# Patient Record
Sex: Female | Born: 1995 | Race: Black or African American | Hispanic: No | Marital: Single | Smoking: Never smoker
Health system: Southern US, Community
[De-identification: ages and names within clinical notes are randomized; demographics above are authoritative.]

## PROBLEM LIST (undated history)

## (undated) DIAGNOSIS — F99 Mental disorder, not otherwise specified: Secondary | ICD-10-CM

---
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
## 2015-08-17 ENCOUNTER — Emergency Department (HOSPITAL_COMMUNITY): Payer: No Typology Code available for payment source

## 2015-08-17 ENCOUNTER — Emergency Department (HOSPITAL_COMMUNITY)
Admission: EM | Admit: 2015-08-17 | Discharge: 2015-08-17 | Disposition: A | Discharge status: Home / Self Care | Payer: No Typology Code available for payment source | Attending: Emergency Medicine | Admitting: Emergency Medicine

## 2015-08-17 ENCOUNTER — Encounter (HOSPITAL_COMMUNITY): Payer: Self-pay | Admitting: *Deleted

## 2015-08-17 DIAGNOSIS — Z8659 Personal history of other mental and behavioral disorders: Secondary | ICD-10-CM | POA: Insufficient documentation

## 2015-08-17 DIAGNOSIS — S0990XA Unspecified injury of head, initial encounter: Secondary | ICD-10-CM | POA: Diagnosis not present

## 2015-08-17 DIAGNOSIS — S3992XA Unspecified injury of lower back, initial encounter: Secondary | ICD-10-CM | POA: Diagnosis not present

## 2015-08-17 DIAGNOSIS — S29001A Unspecified injury of muscle and tendon of front wall of thorax, initial encounter: Secondary | ICD-10-CM | POA: Diagnosis not present

## 2015-08-17 DIAGNOSIS — S161XXA Strain of muscle, fascia and tendon at neck level, initial encounter: Secondary | ICD-10-CM | POA: Insufficient documentation

## 2015-08-17 DIAGNOSIS — Z3202 Encounter for pregnancy test, result negative: Secondary | ICD-10-CM | POA: Insufficient documentation

## 2015-08-17 DIAGNOSIS — R0789 Other chest pain: Secondary | ICD-10-CM

## 2015-08-17 DIAGNOSIS — Y92410 Unspecified street and highway as the place of occurrence of the external cause: Secondary | ICD-10-CM | POA: Insufficient documentation

## 2015-08-17 DIAGNOSIS — Y9389 Activity, other specified: Secondary | ICD-10-CM | POA: Diagnosis not present

## 2015-08-17 DIAGNOSIS — Y998 Other external cause status: Secondary | ICD-10-CM | POA: Insufficient documentation

## 2015-08-17 DIAGNOSIS — S3991XA Unspecified injury of abdomen, initial encounter: Secondary | ICD-10-CM | POA: Insufficient documentation

## 2015-08-17 DIAGNOSIS — R1084 Generalized abdominal pain: Secondary | ICD-10-CM

## 2015-08-17 DIAGNOSIS — S199XXA Unspecified injury of neck, initial encounter: Secondary | ICD-10-CM | POA: Diagnosis present

## 2015-08-17 LAB — CBC
HCT: 34.6 % — ABNORMAL LOW (ref 36.0–46.0)
Hemoglobin: 11.4 g/dL — ABNORMAL LOW (ref 12.0–15.0)
MCH: 26.9 pg (ref 26.0–34.0)
MCHC: 32.9 g/dL (ref 30.0–36.0)
MCV: 81.6 fL (ref 78.0–100.0)
Platelets: 332 10*3/uL (ref 150–400)
RBC: 4.24 MIL/uL (ref 3.87–5.11)
RDW: 12.4 % (ref 11.5–15.5)
WBC: 8.3 10*3/uL (ref 4.0–10.5)

## 2015-08-17 LAB — I-STAT CHEM 8, ED
BUN: 10 mg/dL (ref 6–20)
CALCIUM ION: 1.2 mmol/L (ref 1.12–1.23)
CHLORIDE: 105 mmol/L (ref 101–111)
Creatinine, Ser: 0.7 mg/dL (ref 0.44–1.00)
Glucose, Bld: 81 mg/dL (ref 65–99)
HEMATOCRIT: 38 % (ref 36.0–46.0)
Hemoglobin: 12.9 g/dL (ref 12.0–15.0)
POTASSIUM: 3.7 mmol/L (ref 3.5–5.1)
SODIUM: 142 mmol/L (ref 135–145)
TCO2: 25 mmol/L (ref 0–100)

## 2015-08-17 LAB — I-STAT BETA HCG BLOOD, ED (MC, WL, AP ONLY): I-stat hCG, quantitative: <5 m[IU]/mL

## 2015-08-17 MED ORDER — MORPHINE SULFATE (PF) 4 MG/ML IV SOLN
4.0000 mg | Freq: Once | INTRAVENOUS | Status: AC
Start: 2015-08-17 — End: 2015-08-17
  Administered 2015-08-17: 4 mg via INTRAVENOUS
  Filled 2015-08-17: qty 1

## 2015-08-17 MED ORDER — HYDROCODONE-ACETAMINOPHEN 5-325 MG PO TABS: 1.0000 | ORAL_TABLET | Freq: Four times a day (QID) | ORAL | Status: AC | PRN

## 2015-08-17 MED ORDER — CYCLOBENZAPRINE HCL 10 MG PO TABS: 10.0000 mg | ORAL_TABLET | Freq: Two times a day (BID) | ORAL | Status: AC | PRN

## 2015-08-17 MED ORDER — ONDANSETRON HCL 4 MG/2ML IJ SOLN
4.0000 mg | Freq: Once | INTRAMUSCULAR | Status: AC
  Administered 2015-08-17: 4 mg via INTRAVENOUS
  Filled 2015-08-17: qty 2

## 2015-08-17 MED ORDER — IBUPROFEN 800 MG PO TABS: 800.0000 mg | ORAL_TABLET | Freq: Three times a day (TID) | ORAL | Status: AC

## 2015-08-17 MED ORDER — IOHEXOL 300 MG/ML  SOLN
100.0000 mL | Freq: Once | INTRAMUSCULAR | Status: AC | PRN
  Administered 2015-08-17: 100 mL via INTRAVENOUS
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
## 2015-08-17 NOTE — Discharge Instructions (Signed)
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
## 2015-08-17 NOTE — ED Notes (Signed)
Pt returned from CT °
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
